# Patient Record
Sex: Female | Born: 1975 | Race: White | Marital: Married | Smoking: Never smoker
Health system: Southern US, Community
[De-identification: ages and names within clinical notes are randomized; demographics above are authoritative.]

## PROBLEM LIST (undated history)

## (undated) HISTORY — PX: APPENDECTOMY (OPEN): SHX54

---

## 1998-07-12 ENCOUNTER — Ambulatory Visit
Admit: 1998-07-12 | Disposition: A | Discharge status: Home / Self Care | Payer: Self-pay | Source: Ambulatory Visit | Admitting: Orthopaedic Surgery

## 1999-03-29 ENCOUNTER — Ambulatory Visit: Admission: RE | Admit: 1999-03-29 | Payer: Self-pay | Source: Ambulatory Visit | Admitting: Obstetrics & Gynecology

## 2000-10-25 ENCOUNTER — Inpatient Hospital Stay
Admission: RE | Admit: 2000-10-25 | Disposition: A | Discharge status: Home / Self Care | Payer: Self-pay | Source: Ambulatory Visit | Admitting: Obstetrics & Gynecology

## 2000-10-25 ENCOUNTER — Observation Stay
Admission: RE | Admit: 2000-10-25 | Disposition: A | Discharge status: Home / Self Care | Payer: Self-pay | Source: Ambulatory Visit | Admitting: Obstetrics & Gynecology

## 2001-05-17 ENCOUNTER — Emergency Department: Admit: 2001-05-17 | Payer: Self-pay | Source: Emergency Department | Admitting: Emergency Medicine

## 2003-11-18 ENCOUNTER — Inpatient Hospital Stay
Admission: RE | Admit: 2003-11-18 | Disposition: A | Discharge status: Home / Self Care | Payer: Self-pay | Source: Ambulatory Visit | Admitting: Obstetrics & Gynecology

## 2004-12-06 ENCOUNTER — Emergency Department: Admit: 2004-12-06 | Payer: Self-pay | Source: Emergency Department

## 2007-08-06 ENCOUNTER — Inpatient Hospital Stay
Admission: RE | Admit: 2007-08-06 | Disposition: A | Discharge status: Home / Self Care | Payer: Self-pay | Source: Ambulatory Visit | Admitting: Obstetrics & Gynecology

## 2007-08-06 LAB — HEPATIC FUNCTION PANEL
ALT: 20 U/L (ref 7–56)
AST (SGOT): 27 U/L (ref 5–40)
Albumin/Globulin Ratio: 1.3 (ref 1.1–1.8)
Albumin: 3.6 G/DL — ABNORMAL LOW (ref 3.7–5.1)
Alkaline Phosphatase: 130 U/L — ABNORMAL HIGH (ref 43–122)
Bilirubin Direct: 0 MG/DL — AB (ref 0.0–0.3)
Bilirubin Indirect: 0.4 MG/DL (ref 0.0–1.1)
Bilirubin, Total: 0.3 MG/DL (ref 0.2–1.3)
Globulin: 2.7 G/DL (ref 2.0–3.7)
Protein, Total: 6.3 G/DL (ref 6.0–8.0)

## 2007-08-06 LAB — URINALYSIS
Bilirubin, UA: NEGATIVE
Blood, UA: NEGATIVE
Glucose, UA: NEGATIVE
Ketones UA: NEGATIVE
Leukocyte Esterase, UA: NEGATIVE
Nitrite, UA: NEGATIVE
Protein, UR: NEGATIVE
Specific Gravity UA POCT: <=1.005 (ref <=1.030)
Urine pH: 7 (ref 5.0–8.0)
Urobilinogen, UA: 0.2

## 2007-08-06 LAB — TYPE AND SCREEN: AB Screen Gel: NEGATIVE

## 2007-08-06 LAB — CBC- CERNER
Hematocrit: 38.4 % (ref 37.0–47.0)
Hgb: 13.2 G/DL (ref 12.0–16.0)
MCH: 28.9 PG (ref 28.0–32.0)
MCHC: 34.4 G/DL (ref 32.0–36.0)
MCV: 84.1 FL (ref 80.0–100.0)
MPV: 9.1 FL (ref 7.4–10.4)
Platelets: 198 /mm3 (ref 140–400)
RBC: 4.57 /mm3 (ref 4.20–5.40)
RDW: 13.9 % (ref 11.5–15.0)
WBC: 11.3 /mm3 — ABNORMAL HIGH (ref 3.5–10.8)

## 2007-08-06 LAB — PT AND APTT
PT INR: 1 {INR} (ref 0.9–1.1)
PT: 12 s (ref 10.8–13.3)
PTT: 25 s (ref 21–32)

## 2007-08-06 LAB — FIBRINOGEN: Fibrinogen: 603 MG/DL — ABNORMAL HIGH (ref 194–420)

## 2007-08-06 LAB — URIC ACID: Uric acid: 6.9 MG/DL (ref 2.5–7.5)

## 2007-08-06 LAB — GFR

## 2007-08-06 LAB — LACTATE DEHYDROGENASE: LDH: 387 U/L (ref 307–575)

## 2007-08-06 LAB — CREATININE, SERUM: Creatinine: 0.8 MG/DL (ref 0.5–1.4)

## 2007-08-07 LAB — HEMOGLOBIN AND HEMATOCRIT, BLOOD
Hematocrit: 23.7 % — ABNORMAL LOW (ref 37.0–47.0)
Hgb: 8.2 G/DL — ABNORMAL LOW (ref 12.0–16.0)

## 2011-10-12 NOTE — Op Note (Signed)
ROOM NUMBER:                           [id number]  [id number]            SURGEON:                                Phil Dopp, MD            DATE:                                   08/06/2007                  PREOPERATIVE DIAGNOSES:  Previous cesarean section, pregnancy-induced      hypertension, multiparity, for voluntary sterilization.            POSTOPERATIVE DIAGNOSES:  Previous cesarean section, pregnancy-induced      hypertension, multiparity, for voluntary sterilization.            OPERATION:  Repeat low transverse cesarean section.            ANESTHESIA:  Spinal with Duramorph.            ASSISTANT:  Glenice Bow, R.N.F.A.            ESTIMATED BLOOD LOSS:  700 mL.            FLUIDS:  1400 mL crystalloid.            PROCEDURE:  The patient was identified, taken to the operating room and      placed in a sitting position.  Anesthesia was administered.  She was placed      in a supine position with a roll under her right hip.  Fetal hearts were      obtained in the 140s.  She was prepped and draped in a sterile manner and      had a Foley catheter placed within her bladder.  Anesthesia was tested with      Allis clamps and found to be adequate.  She had a repeat Pfannenstiel      incision made through her old incision.  The skin incision was carried down      to the fascia by sharp and blunt dissection.  The fascia was then nicked in      the midline with a scalpel and extended laterally with sharp dissection.      The fascia was then separated from the recti muscles by blunt dissection.      The recti muscles were separated bluntly.  The peritoneum was entered      without any difficulty.  There was a lot of omentum in the incision but      there were no adhesions of the omentum to the uterus, it was all just to      the peritoneal surface.  Once we entered the peritoneum, we were able to      push back the omentum without any difficulty and placed an Alexis retractor      in the incision.  The uterine incision was made  without difficulty after      the bladder flap was taken down. The bladder flap was easy to take down.      The lower uterine segment was very, very thin and was  bulging but it was      intact.  The uterine incision was made with a scalpel.  Fluid was clear.      The baby was delivered at 1851.  It was a viable female infant.  The baby      was suctioned on the abdomen.  The cord was clamped and cut.  The baby was      handed to waiting pediatricians.  Apgars were 9 and 9.  Weight was 9 pounds      4 ounces.  The cord bloods were obtained.  The placenta was delivered      spontaneously.  It was intact with 3 vessels.  The cervix was probed and      found to be patent.  The uterine incision was closed with 0 chromic in a      running, locking suture.  Excellent hemostasis was noted.  The bladder flap      was closed with 2-0 chromic.  We then had to proceed with a tubal, was able      to elevate a 2-cm knuckle of tube bilaterally.  It was tied off with 0      chromic, allowed to fall back in the abdomen.  Both ovaries were normal.      Excellent hemostasis was noted from the sites. The Alexis retractor was      removed.  The peritoneum was closed with 2-0 chromic.  The fascia was      closed with 0 Vicryl.  The subcutaneous tissues were irrigated, made      hemostatic and brought together with interrupted sutures of 2-0 plain.  The      skin was closed with 4-0 Vicryl.  The patient tolerated the procedure well.      Sponge, laparotomy pad and instrument counts were correct at the end of the      procedure. She was taken to the recovery room.  The Foley was draining      clear yellow urine.                                    Electronic Signing MD: Phil Dopp, MD            S W:[id number]      Dictated:    08/07/2007  1:21 P      Transcribed: 08/08/2007  9:27 A      Job Number: [id number]      Document Number: [id number]            CC:  Phil Dopp, MD

## 2012-12-17 NOTE — Op Note (Unsigned)
ROOM NUMBER:                           [id number]  [id number]            SURGEON:                                Raymondo Band, MD            DATE:                                   11/18/2003                        PREOPERATIVE DIAGNOSIS:  Intrauterine pregnancy at term, previous cesarean      section, desires repeat.            POSTOPERATIVE DIAGNOSIS:  Intrauterine pregnancy at term, previous cesarean      section, desires repeat.            OPERATION:  Repeat low transverse cesarean section.            ANESTHESIA:  Epidural.            ESTIMATED BLOOD LOSS:  750 cc.            COMPLICATIONS:       None.            DRAINS:  Foley catheter in the bladder.            FINDINGS:  Viable female infant, 9 pounds 3 ounces, Apgars of 8 and 9.      Normal placenta with 3-vessel cord.  Normal tubes and ovaries bilaterally.                  PROCEDURE:  The patient was counseled preoperatively.  Under satisfactory      epidural anesthesia, the patient was prepped and draped in the usual      sterile fashion in the dorsal supine position after Foley catheter had been      placed in the bladder.  The level of the epidural was checked and noted to      be adequate.            A Pfannenstiel incision was made through the skin with the knife.  The      incision was carried down to the level of fascia which was scored with a      knife and incised laterally using Mayo scissors.  The incision was carried      down to the level of the peritoneum which was picked up with hemostats,      entered with Metzenbaum scissors and the incision was extended superiorly      and inferiorly.  The bladder blade was placed.  Bladder flap was developed      using Metzenbaum scissors.  The bladder blade was repositioned.  The uterus      was entered sharply in the midline.  Clear fluid was noted.  The uterine      incision was extended laterally using bandage scissors in order to deliver      the infant, the left rectus muscle was cut approximately 1/3 of the  way      across the belly.  The  infant was delivered from occiput transverse      position.  The mouth and nose were suctioned.  The cord was clamped and      cut.  The baby was handed to the pediatrician.  Cord blood was obtained.      The placenta was manually extracted.  The endometrial cavity was noted to      be empty.  The uterine edges were identified and closed in a running      locking manner using #1 chromic suture.  A second layer of interrupted      figure-of-eight 0 chromic sutures were placed, and good hemostasis was      noted.  Copious irrigation was used.  The bladder flap was then closed in a      running manner using 2-0 chromic suture.  The tubes and ovaries were noted      to be normal bilaterally.  The peritoneum was then closed in a running      manner using 2-0 chromic suture.  The rectus muscles were reapproximated      with interrupted figure-of-eight 2-0 chromic suture.  The fascia was closed      with 0 Vicryl suture in a running manner, two separate sutures meeting in      the midline.  The subcutaneous tissue was closed with a running 3-0 Vicryl      suture.  The skin was closed with 4-0 Dexon in a subcuticular manner.      Sterile dressing was applied.            Sponge and needle counts were correct x 2.  The Foley was noted to be      draining clear urine following the procedure.  The patient tolerated the      procedure well and was returned to the recovery room in satisfactory      condition.                                          ______________________________     Date Signed: __________      Raymondo Band, MD                  RAW:cf      Dictated:    11/18/2003  9:45 A      Transcribed: 11/19/2003 11:13 A      Job Number: [id number]      Document Number: [id number]            CC:  Raymondo Band, MD

## 2016-01-02 ENCOUNTER — Encounter (INDEPENDENT_AMBULATORY_CARE_PROVIDER_SITE_OTHER): Payer: Self-pay | Admitting: Registered Nurse

## 2016-01-02 ENCOUNTER — Ambulatory Visit (INDEPENDENT_AMBULATORY_CARE_PROVIDER_SITE_OTHER): Payer: BC Managed Care – PPO | Admitting: Registered Nurse

## 2016-01-02 VITALS — BP 159/95 | HR 74 | Temp 99.3°F | Resp 18 | Ht 72.0 in | Wt 275.0 lb

## 2016-01-02 DIAGNOSIS — J0100 Acute maxillary sinusitis, unspecified: Secondary | ICD-10-CM

## 2016-01-02 DIAGNOSIS — J0110 Acute frontal sinusitis, unspecified: Secondary | ICD-10-CM

## 2016-01-02 DIAGNOSIS — J069 Acute upper respiratory infection, unspecified: Secondary | ICD-10-CM

## 2016-01-02 DIAGNOSIS — R05 Cough: Secondary | ICD-10-CM

## 2016-01-02 DIAGNOSIS — R059 Cough, unspecified: Secondary | ICD-10-CM

## 2016-01-02 MED ORDER — DOXYCYCLINE HYCLATE 100 MG PO CAPS
100.0000 mg | ORAL_CAPSULE | Freq: Two times a day (BID) | ORAL | Status: AC
Start: 2016-01-02 — End: 2016-01-12

## 2016-01-02 MED ORDER — ALBUTEROL SULFATE HFA 108 (90 BASE) MCG/ACT IN AERS
2.0000 | INHALATION_SPRAY | Freq: Four times a day (QID) | RESPIRATORY_TRACT | Status: AC | PRN
Start: 2016-01-02 — End: ?

## 2016-01-02 NOTE — Progress Notes (Signed)
[facility]    PROGRESS NOTE      Patient: Andrea Chambers   Date: 01/02/2016   MRN: [id number]     History reviewed. No pertinent past medical history.  Social History     Social History   . Marital Status: Married     Spouse Name: N/A   . Number of Children: N/A   . Years of Education: N/A     Occupational History   . Not on file.     Social History Main Topics   . Smoking status: Never Smoker    . Smokeless tobacco: Not on file   . Alcohol Use: 0.0 oz/week     0 Standard drinks or equivalent per week   . Drug Use: Not on file   . Sexual Activity: Not on file     Other Topics Concern   . Not on file     Social History Narrative   . No narrative on file     Family History   Problem Relation Age of Onset   . Hypertension Father    . Diabetes Father        ASSESSMENT/PLAN     Andrea Chambers is a 40 y.o. female    Chief Complaint   Patient presents with   . Sinus Problem        1. Acute non-recurrent maxillary sinusitis  - doxycycline (VIBRAMYCIN) 100 MG capsule; Take 1 capsule (100 mg total) by mouth 2 (two) times daily.  Dispense: 20 capsule; Refill: 0    2. Acute non-recurrent frontal sinusitis  - doxycycline (VIBRAMYCIN) 100 MG capsule; Take 1 capsule (100 mg total) by mouth 2 (two) times daily.  Dispense: 20 capsule; Refill: 0    3. Upper respiratory tract infection, unspecified type  - albuterol (PROAIR HFA) 108 (90 Base) MCG/ACT inhaler; Inhale 2 puffs into the lungs every 6 (six) hours as needed for Wheezing.  Dispense: 1 Inhaler; Refill: 0    4. Cough  - albuterol (PROAIR HFA) 108 (90 Base) MCG/ACT inhaler; Inhale 2 puffs into the lungs every 6 (six) hours as needed for Wheezing.  Dispense: 1 Inhaler; Refill: 0      Increasing fluid 8-10 cups , rest, Utilize throat lozenges, warm salt water gargles, and chloraseptic nasal spray. Vitamin C 1000mg  once  a day. Tylenol as needed for sore throat or bodyache. Zyrtec 10mg  once a day for running nose as needed. Mucinex OTC as needed  for cough.  Probiotics or yogurt twice a day during antibotics treatment. Side effects of antibotics reviewed with patient.      Results for orders placed or performed during the hospital encounter of 08/06/07   CBC-   Result Value Ref Range    WBC 11.3 (H) 3.5 - 10.8 /CUMM    RBC 4.57 4.20 - 5.40 /CUMM    Hgb 13.2 12.0 - 16.0 G/DL    Hematocrit 40.3 47.4 - 47.0 %    MCV 84.1 80.0 - 100.0 FL    MCH 28.9 28.0 - 32.0 PG    MCHC 34.4 32.0 - 36.0 G/DL    RDW 25.9 56.3 - 87.5 %    Platelets 198 140 - 400 /CUMM    MPV 9.1 7.4 - 10.4 FL   PT/APTT   Result Value Ref Range    PT 12.0 10.8 - 13.3 SEC    PT INR 1.0 0.9 - 1.1 INR    PTT 25 21 - 32 SEC  Fibrinogen   Result Value Ref Range    Fibrinogen 603 (H) 194 - 420 MG/DL   Creatinine, serum   Result Value Ref Range    Creatinine 0.8 0.5 - 1.4 MG/DL   Lactate dehydrogenase   Result Value Ref Range    LDH 387 307 - 575 U/L   Hepatic function panel (LFT)   Result Value Ref Range    Bilirubin, Total 0.3 0.2 - 1.3 MG/DL    Bilirubin, Direct 0.0 (R) 0.0 - 0.3 MG/DL    Bilirubin, Indirect 0.4 0.0 - 1.1 MG/DL    AST (SGOT) 27 5 - 40 U/L    ALT 20 7 - 56 U/L    Alkaline Phosphatase 130 (H) 43 - 122 U/L    Protein, Total 6.3 6.0 - 8.0 G/DL    Albumin 3.6 (L) 3.7 - 5.1 G/DL    Globulin 2.7 2.0 - 3.7 G/DL    Albumin/Globulin Ratio 1.3 1.1 - 1.8   Uric acid   Result Value Ref Range    Uric acid 6.9 2.5 - 7.5 MG/DL   Urinalysis   Result Value Ref Range    Urine Type UCC     Color, UA YELLOW YELLOW    Clarity, UA CLEAR CLEAR    Specific Gravity UA POCT <=1.005 <=1.030    Urine pH 7.0 5.0 - 8.0    Protein, UR NEGATIVE NEGmg/dl    Glucose, UA NEGATIVE NEGmg/dl    Ketones UA NEGATIVE NEGmg/dl    Bilirubin, UA NEGATIVE NEGATIVE    Blood, UA NEGATIVE NEGATIVE    Nitrite, UA NEGATIVE NEGATIVE    Urobilinogen, UA 0.2 <=1EU    Leukocyte Esterase, UA NEGATIVE NEGATIVE   GFR   Result Value Ref Range    EGFR * mg/dl   Hemoglobin and hematocrit, blood   Result Value Ref Range    Hgb 8.2 (L) 12.0 -  16.0 G/DL    Hematocrit 45.4 (L) 37.0 - 47.0 %   Type and Screen   Result Value Ref Range    ABO Rh APOS     Specimen Expiration 15AUG08     AB Screen Gel NEGATIVE        Risk & Benefits of the new medication(s) were explained to the patient (and family) who verbalized understanding & agreed to the treatment plan. Patient (family) encouraged to contact me/clinical staff with any questions/concerns      MEDICATIONS     Current Outpatient Prescriptions   Medication Sig Dispense Refill   . albuterol (PROAIR HFA) 108 (90 Base) MCG/ACT inhaler Inhale 2 puffs into the lungs every 6 (six) hours as needed for Wheezing. 1 Inhaler 0   . doxycycline (VIBRAMYCIN) 100 MG capsule Take 1 capsule (100 mg total) by mouth 2 (two) times daily. 20 capsule 0     No current facility-administered medications for this visit.       No Known Allergies    SUBJECTIVE     Chief Complaint   Patient presents with   . Sinus Problem        Sinus Problem  This is a new problem. Episode onset: 10 days ago. The problem is unchanged. Maximum temperature: had fever for 2 days 10 days ago. Higest temp was 101.0  The fever has been present for 1 to 2 days. Her pain is at a severity of 10/10. The pain is severe. Associated symptoms include chills, coughing, headaches, a hoarse voice, shortness of breath (during cough) and sinus pressure. Pertinent  negatives include no congestion, ear pain, neck pain, sneezing, sore throat or swollen glands. (Body ache,postnasl drip, facial pain and pressure.) Treatments tried: mucinex,robitussion DM. The treatment provided no relief.       ROS     Review of Systems   Constitutional: Positive for chills and fatigue. Negative for fever.   HENT: Positive for hoarse voice, postnasal drip and sinus pressure. Negative for congestion, ear discharge, ear pain, sneezing, sore throat and voice change.    Eyes: Negative for pain.   Respiratory: Positive for cough and shortness of breath (during cough).    Cardiovascular: Negative for  chest pain and palpitations.   Gastrointestinal: Negative for nausea, vomiting and abdominal pain.   Musculoskeletal: Negative for myalgias and neck pain.   Skin: Negative for rash.   Neurological: Positive for headaches.   Hematological: Negative for adenopathy.       The following portions of the patient's history were reviewed and updated as appropriate: Allergies, Current Medications, Past Family History, Past Medical history, Past social history, Past surgical history, and Problem List.    PHYSICAL EXAM     Filed Vitals:    01/02/16 1200 01/02/16 1205   BP: 191/102 159/95   Pulse: 74    Temp: 99.3 F (37.4 C)    Resp: 18    Height: 1.829 m (6')    Weight: 124.739 kg (275 lb)    SpO2: 97%        Physical Exam   Nursing note and vitals reviewed.  Constitutional: She is oriented to person, place, and time. She appears well-developed and well-nourished.   HENT:   Head: Normocephalic.   Right Ear: Tympanic membrane and ear canal normal.   Left Ear: Tympanic membrane and ear canal normal.   Nose: Mucosal edema present. No sinus tenderness. Right sinus exhibits maxillary sinus tenderness and frontal sinus tenderness. Left sinus exhibits maxillary sinus tenderness and frontal sinus tenderness.   Mouth/Throat: Uvula is midline, oropharynx is clear and moist and mucous membranes are normal.   Eyes: Conjunctivae and EOM are normal. Pupils are equal, round, and reactive to light.   Neck: Normal range of motion. Neck supple.   Cardiovascular: Normal rate, regular rhythm and normal heart sounds.    Pulmonary/Chest: Effort normal and breath sounds normal. No respiratory distress.   Neurological: She is alert and oriented to person, place, and time. She has normal reflexes.   Psychiatric: She has a normal mood and affect. Her speech is normal. Judgment normal.     Ortho Exam  Neurologic Exam     Mental Status   Oriented to person, place, and time.   Speech: speech is normal     Cranial Nerves     CN III, IV, VI   Pupils are  equal, round, and reactive to light.  Extraocular motions are normal.       PROCEDURE(S)     Procedures        Signed,  Alinda Money, FNP  01/02/2016

## 2023-08-15 IMAGING — MR MRI PELVIS WITHOUT AND WITH CONTRAST
9 series · 48 of 48 positions shown · IV contrast (Off)
Comparison: Outside CT from [facility] dated 07/19/2023.

﻿MRI OF THE PELVIS WITHOUT AND WITH CONTRAST
CLINICAL HISTORY: Adnexal mass.
TECHNIQUE: Multiplanar, multisequence MRI was performed on a [DATE] Tesla open MRI without and with 12 mL Gadavist.

[Series 1: scano cor/sag · sagittal · 6.0mm · 1.48mm/px · 2 of 6 slices shown]
[im 1/6]
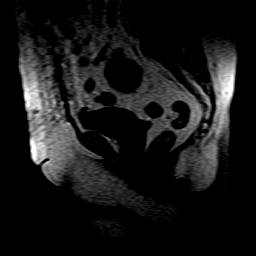
[im 6/6]
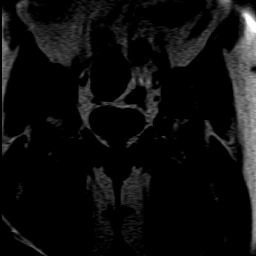

[Series 2: T1 · axial · 7.0mm · 1.33mm/px · z∈[-124,+45]mm · 6 of 24 slices shown (1 of 5)]
[im 1/24]
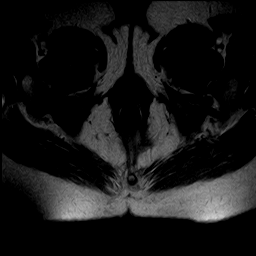
[im 5/24]
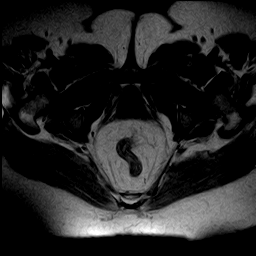
[im 10/24]
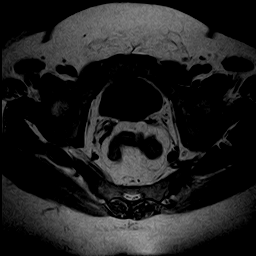
[im 14/24]
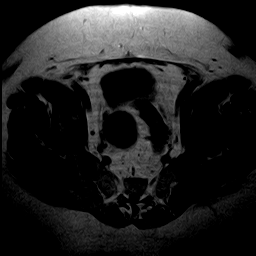
[im 19/24]
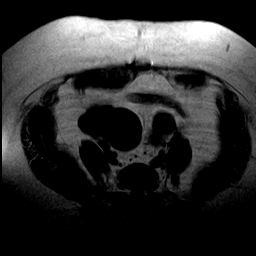
[im 24/24]
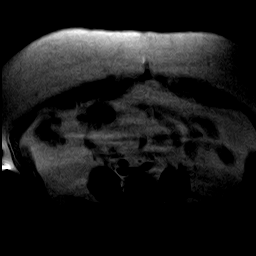

[Series 3: T2 · axial · 7.0mm · 1.33mm/px · z∈[-124,+45]mm · 6 of 24 slices shown (1 of 3)]
[im 1/24]
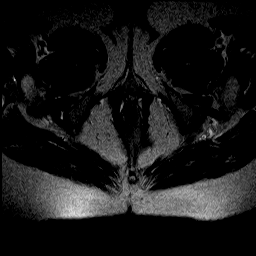
[im 5/24]
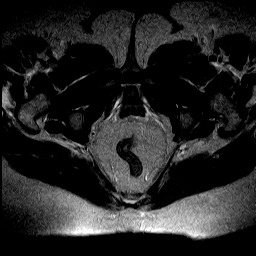
[im 10/24]
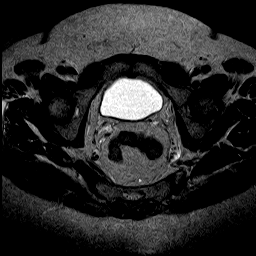
[im 14/24]
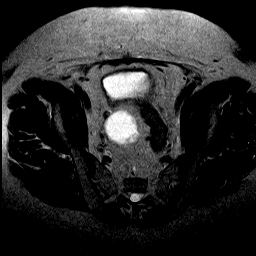
[im 19/24]
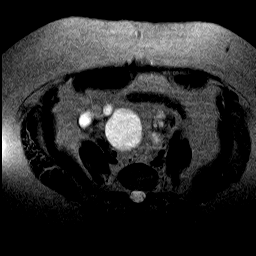
[im 24/24]
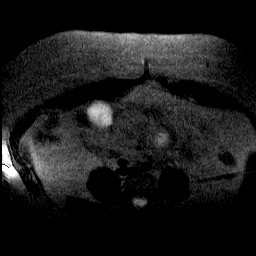

[Series 4: T2 · sagittal · 6.0mm · 1.17mm/px · 6 of 24 slices shown (2 of 3)]
[im 1/24]
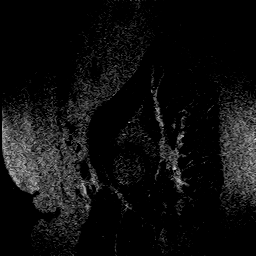
[im 5/24]
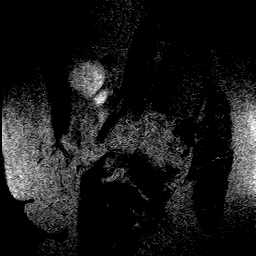
[im 10/24]
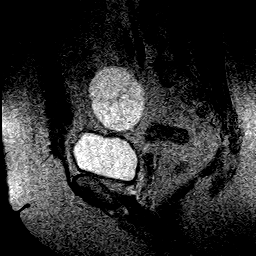
[im 14/24]
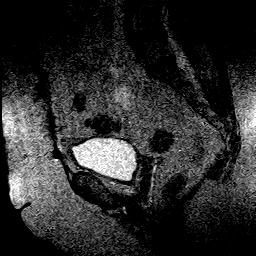
[im 19/24]
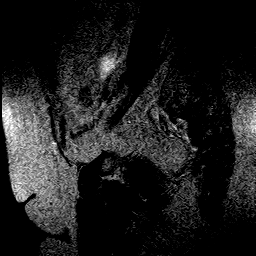
[im 24/24]
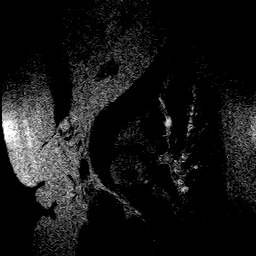

[Series 5: T1 · sagittal · 6.0mm · 1.17mm/px · 6 of 24 slices shown (2 of 5)]
[im 1/24]
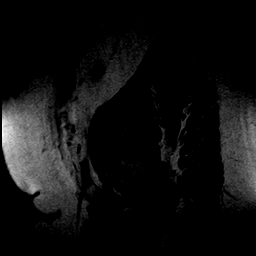
[im 5/24]
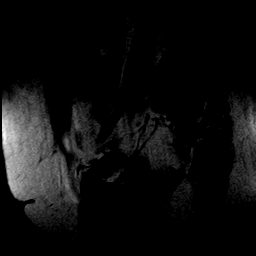
[im 10/24]
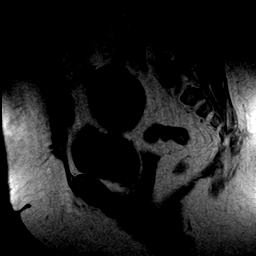
[im 14/24]
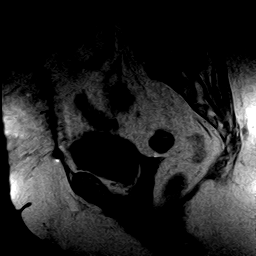
[im 19/24]
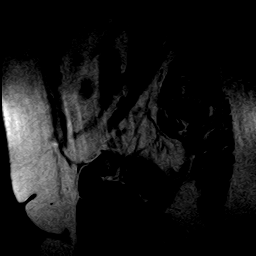
[im 24/24]
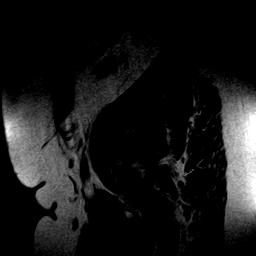

[Series 6: T2 · coronal · 8.0mm · 1.41mm/px · 5 of 20 slices shown (3 of 3)]
[im 1/20]
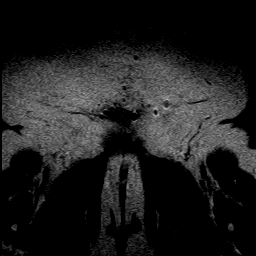
[im 5/20]
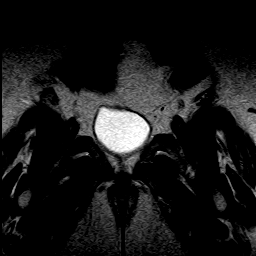
[im 10/20]
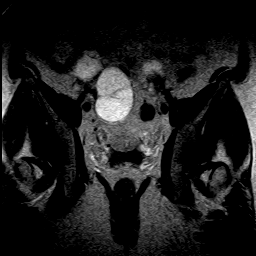
[im 15/20]
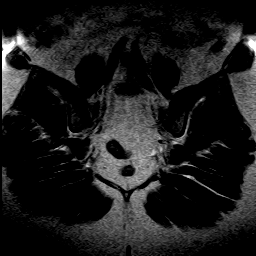
[im 20/20]
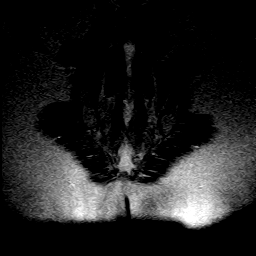

[Series 7: T1 · axial · 7.0mm · 1.33mm/px · z∈[-126,+54]mm · 6 of 24 slices shown (3 of 5)]
[im 1/24]
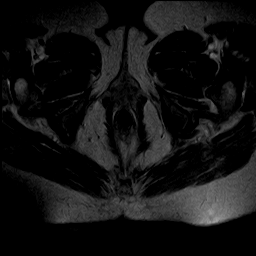
[im 5/24]
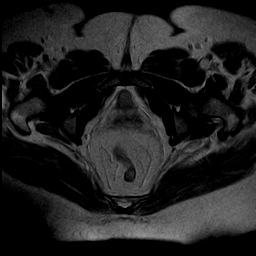
[im 10/24]
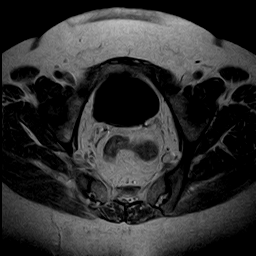
[im 14/24]
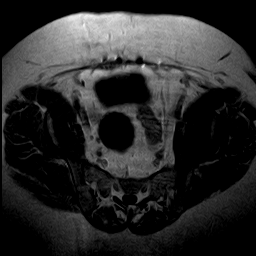
[im 19/24]
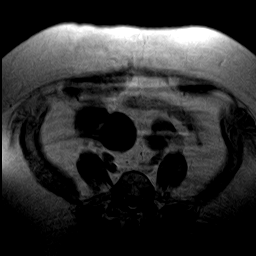
[im 24/24]
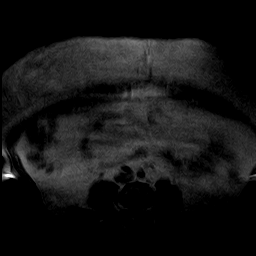

[Series 8: T1 · sagittal · 6.0mm · 1.17mm/px · 6 of 24 slices shown (4 of 5)]
[im 1/24]
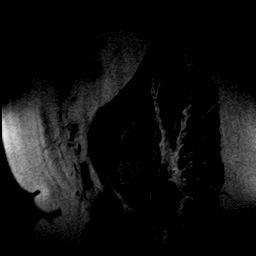
[im 5/24]
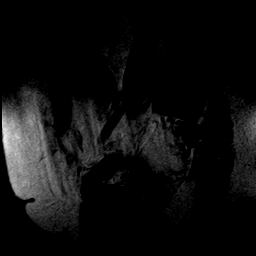
[im 10/24]
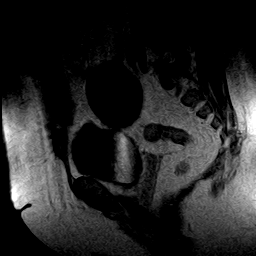
[im 14/24]
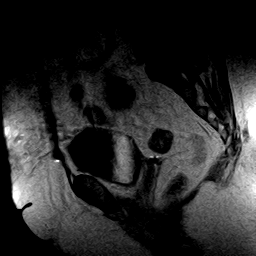
[im 19/24]
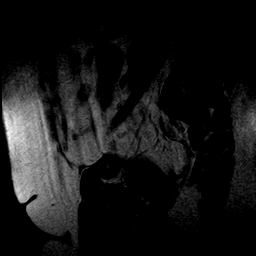
[im 24/24]
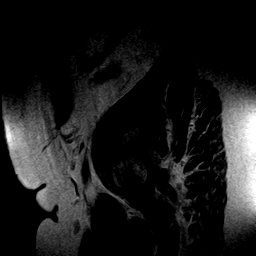

[Series 9: T1 · coronal · 8.0mm · 1.41mm/px · 5 of 20 slices shown (5 of 5)]
[im 1/20]
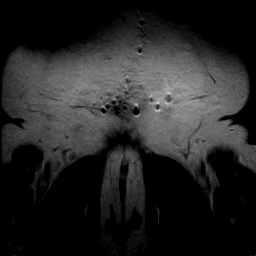
[im 5/20]
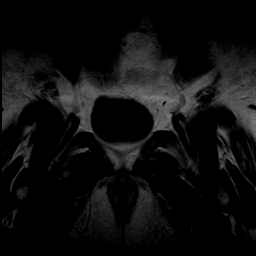
[im 10/20]
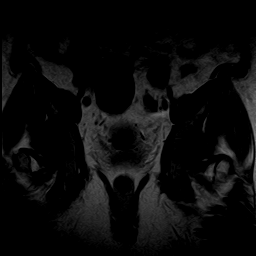
[im 15/20]
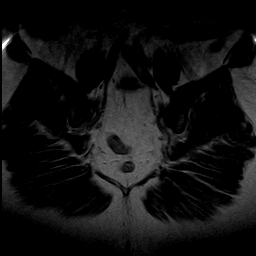
[im 20/20]
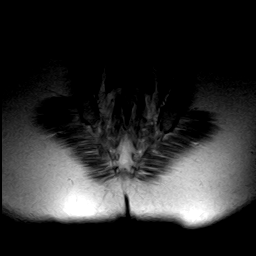

[48 of 48 positions shown; findings below may reference images not displayed]

FINDINGS: There is a multiloculated, complex right ovarian cystic lesion, the confluence of which measures 8.1 cm in sagittal length by 6.7 cm in AP dimension by 5.6 cm in transverse dimension, differential includes ovarian cystadenoma versus ovarian cystadenocarcinoma.  In addition, there is a smaller right ovarian complex cystic lesion measuring 4.9 x 5.3 x 6.2 cm.  In addition, smaller right ovarian cysts are identified.  

Left ovarian cyst identified measuring 4.3 x 3.7 cm.  Left ovarian complex cyst identified measuring 2.2 x 1.8 cm.  

Surgical absence of the uterus. 

Diverticulosis of the sigmoid colon without diverticulitis. 

No evidence of iliac chain or inguinal lymphadenopathy.  No evidence of free fluid.  

The urinary bladder appears grossly unremarkable.
IMPRESSION: 1. Enlargement of the right ovary with multiple complex and simple cystic lesions, the largest right ovarian complex cystic lesion measures 8.1 x 6.7 x 5.6 cm, differential includes ovarian cystadenoma versus ovarian cystadenocarcinoma.  

2. Left ovarian complex cystic lesion measuring 1.8 x 2.2 cm with septations.  Left ovarian simple cyst measuring 3.7 x 4.3 cm.  

3. Surgical absence of the uterus.  

4. No evidence of inguinal chain or inguinal lymphadenopathy. 

5. Diverticulosis of the sigmoid colon.
